# Patient Record
Sex: Female | Born: 1960 | Race: Black or African American | Hispanic: No | Marital: Married | Smoking: Never smoker
Health system: Southern US, Community
[De-identification: ages and names within clinical notes are randomized; demographics above are authoritative.]

## PROBLEM LIST (undated history)

## (undated) DIAGNOSIS — I10 Essential (primary) hypertension: Secondary | ICD-10-CM

## (undated) HISTORY — PX: ABDOMINAL HYSTERECTOMY: SHX81

---

## 1998-07-07 ENCOUNTER — Other Ambulatory Visit: Admission: RE | Admit: 1998-07-07 | Discharge: 1998-07-07 | Payer: Self-pay | Admitting: Obstetrics & Gynecology

## 2000-01-18 ENCOUNTER — Other Ambulatory Visit: Admission: RE | Admit: 2000-01-18 | Discharge: 2000-01-18 | Payer: Self-pay | Admitting: Obstetrics and Gynecology

## 2001-02-11 ENCOUNTER — Other Ambulatory Visit: Admission: RE | Admit: 2001-02-11 | Discharge: 2001-02-11 | Payer: Self-pay | Admitting: Obstetrics and Gynecology

## 2002-02-19 ENCOUNTER — Other Ambulatory Visit: Admission: RE | Admit: 2002-02-19 | Discharge: 2002-02-19 | Payer: Self-pay | Admitting: Obstetrics and Gynecology

## 2002-09-05 ENCOUNTER — Ambulatory Visit (HOSPITAL_COMMUNITY): Admission: RE | Admit: 2002-09-05 | Discharge: 2002-09-05 | Payer: Self-pay | Admitting: General Surgery

## 2002-09-05 ENCOUNTER — Encounter (INDEPENDENT_AMBULATORY_CARE_PROVIDER_SITE_OTHER): Payer: Self-pay | Admitting: Specialist

## 2008-11-26 ENCOUNTER — Ambulatory Visit: Payer: Self-pay | Admitting: Sports Medicine

## 2008-11-26 DIAGNOSIS — I10 Essential (primary) hypertension: Secondary | ICD-10-CM | POA: Insufficient documentation

## 2008-11-26 DIAGNOSIS — M25569 Pain in unspecified knee: Secondary | ICD-10-CM | POA: Insufficient documentation

## 2008-11-26 DIAGNOSIS — M2140 Flat foot [pes planus] (acquired), unspecified foot: Secondary | ICD-10-CM | POA: Insufficient documentation

## 2008-11-26 DIAGNOSIS — M25559 Pain in unspecified hip: Secondary | ICD-10-CM | POA: Insufficient documentation

## 2010-07-09 ENCOUNTER — Encounter: Payer: Self-pay | Admitting: *Deleted

## 2010-08-05 ENCOUNTER — Emergency Department (INDEPENDENT_AMBULATORY_CARE_PROVIDER_SITE_OTHER): Payer: Federal, State, Local not specified - PPO

## 2010-08-05 ENCOUNTER — Emergency Department (HOSPITAL_BASED_OUTPATIENT_CLINIC_OR_DEPARTMENT_OTHER)
Admission: EM | Admit: 2010-08-05 | Discharge: 2010-08-05 | Disposition: A | Discharge status: Home / Self Care | Payer: Federal, State, Local not specified - PPO | Attending: Emergency Medicine | Admitting: Emergency Medicine

## 2010-08-05 DIAGNOSIS — R059 Cough, unspecified: Secondary | ICD-10-CM

## 2010-08-05 DIAGNOSIS — R05 Cough: Secondary | ICD-10-CM

## 2010-08-05 DIAGNOSIS — J189 Pneumonia, unspecified organism: Secondary | ICD-10-CM | POA: Insufficient documentation

## 2010-08-05 DIAGNOSIS — R0989 Other specified symptoms and signs involving the circulatory and respiratory systems: Secondary | ICD-10-CM

## 2010-08-05 DIAGNOSIS — I10 Essential (primary) hypertension: Secondary | ICD-10-CM | POA: Insufficient documentation

## 2010-08-05 DIAGNOSIS — R509 Fever, unspecified: Secondary | ICD-10-CM

## 2020-12-15 ENCOUNTER — Encounter (HOSPITAL_BASED_OUTPATIENT_CLINIC_OR_DEPARTMENT_OTHER): Payer: Self-pay

## 2020-12-15 ENCOUNTER — Emergency Department (HOSPITAL_BASED_OUTPATIENT_CLINIC_OR_DEPARTMENT_OTHER): Payer: 59

## 2020-12-15 ENCOUNTER — Emergency Department (HOSPITAL_BASED_OUTPATIENT_CLINIC_OR_DEPARTMENT_OTHER)
Admission: EM | Admit: 2020-12-15 | Discharge: 2020-12-15 | Disposition: A | Discharge status: Home / Self Care | Payer: 59 | Attending: Emergency Medicine | Admitting: Emergency Medicine

## 2020-12-15 ENCOUNTER — Other Ambulatory Visit: Payer: Self-pay

## 2020-12-15 DIAGNOSIS — Z79899 Other long term (current) drug therapy: Secondary | ICD-10-CM | POA: Insufficient documentation

## 2020-12-15 DIAGNOSIS — R0602 Shortness of breath: Secondary | ICD-10-CM | POA: Diagnosis not present

## 2020-12-15 DIAGNOSIS — R079 Chest pain, unspecified: Secondary | ICD-10-CM

## 2020-12-15 DIAGNOSIS — J90 Pleural effusion, not elsewhere classified: Secondary | ICD-10-CM | POA: Insufficient documentation

## 2020-12-15 DIAGNOSIS — I10 Essential (primary) hypertension: Secondary | ICD-10-CM | POA: Diagnosis not present

## 2020-12-15 DIAGNOSIS — Z20822 Contact with and (suspected) exposure to covid-19: Secondary | ICD-10-CM | POA: Diagnosis not present

## 2020-12-15 DIAGNOSIS — R0789 Other chest pain: Secondary | ICD-10-CM | POA: Diagnosis present

## 2020-12-15 DIAGNOSIS — I313 Pericardial effusion (noninflammatory): Secondary | ICD-10-CM

## 2020-12-15 DIAGNOSIS — I3139 Other pericardial effusion (noninflammatory): Secondary | ICD-10-CM

## 2020-12-15 HISTORY — DX: Essential (primary) hypertension: I10

## 2020-12-15 LAB — TROPONIN I (HIGH SENSITIVITY)
Troponin I (High Sensitivity): 3 ng/L (ref <18)
Troponin I (High Sensitivity): 3 ng/L (ref <18)

## 2020-12-15 LAB — BASIC METABOLIC PANEL
Anion gap: 11 (ref 5–15)
BUN: 22 mg/dL — ABNORMAL HIGH (ref 6–20)
CO2: 24 mmol/L (ref 22–32)
Calcium: 9.2 mg/dL (ref 8.9–10.3)
Chloride: 102 mmol/L (ref 98–111)
Creatinine, Ser: 1.05 mg/dL — ABNORMAL HIGH (ref 0.44–1.00)
GFR, Estimated: >60 mL/min (ref >60)
Glucose, Bld: 120 mg/dL — ABNORMAL HIGH (ref 70–99)
Potassium: 3.4 mmol/L — ABNORMAL LOW (ref 3.5–5.1)
Sodium: 137 mmol/L (ref 135–145)

## 2020-12-15 LAB — CBC
HCT: 36 % (ref 36.0–46.0)
Hemoglobin: 11.3 g/dL — ABNORMAL LOW (ref 12.0–15.0)
MCH: 22.3 pg — ABNORMAL LOW (ref 26.0–34.0)
MCHC: 31.4 g/dL (ref 30.0–36.0)
MCV: 71 fL — ABNORMAL LOW (ref 80.0–100.0)
Platelets: 290 10*3/uL (ref 150–400)
RBC: 5.07 MIL/uL (ref 3.87–5.11)
RDW: 16.3 % — ABNORMAL HIGH (ref 11.5–15.5)
WBC: 10.1 10*3/uL (ref 4.0–10.5)
nRBC: 0 % (ref 0.0–0.2)

## 2020-12-15 LAB — RESP PANEL BY RT-PCR (FLU A&B, COVID) ARPGX2
Influenza A by PCR: NEGATIVE
Influenza B by PCR: NEGATIVE
SARS Coronavirus 2 by RT PCR: NEGATIVE

## 2020-12-15 LAB — BRAIN NATRIURETIC PEPTIDE: B Natriuretic Peptide: 15.1 pg/mL (ref 0.0–100.0)

## 2020-12-15 LAB — D-DIMER, QUANTITATIVE: D-Dimer, Quant: 1.09 ug/mL-FEU — ABNORMAL HIGH (ref 0.00–0.50)

## 2020-12-15 MED ORDER — FUROSEMIDE 10 MG/ML IJ SOLN
40.0000 mg | Freq: Once | INTRAMUSCULAR | Status: DC
  Filled 2020-12-15: qty 4

## 2020-12-15 MED ORDER — IOHEXOL 350 MG/ML SOLN
100.0000 mL | Freq: Once | INTRAVENOUS | Status: AC | PRN
  Administered 2020-12-15: 100 mL via INTRAVENOUS

## 2020-12-15 NOTE — ED Provider Notes (Signed)
[facility] EMERGENCY DEPARTMENT Provider Note   CSN: [id number] Arrival date & time: 12/15/20  1601     History Chief Complaint  Patient presents with   Chest Pain    Carrie Bright is a 60 y.o. female with Pmhx HTN who presents to the ED today with complaint of gradual onset, constant, substernal chest tightness that began 5-6 days ago. Pt also complains of dyspnea on exertion.  She reports issues intermittently for the past few months however has never been evaluated for same.  She states that it has been constant for the last couple of days however she noticed today while she was walking she could not finish her regular loop causing concern of prompting ED visit.  She does mention that 2 weeks ago she drove to and from [place] which is a 12-hour drive one way.  She also mentions that at 1 week ago she was having a pedicure and they were massaging her right calf and she noticed some pain and soreness to same.  She denies any history of DVT/PE.  She is not on exogenous hormones.  She denies any active malignancy.  No hemoptysis.  She denies any fevers or chills.  Denies any recent COVID infection.  She denies any orthopnea.   The history is provided by the patient and medical records.      Past Medical History:  Diagnosis Date   Hypertension     Patient Active Problem List   Diagnosis Date Noted   HYPERTENSION, BENIGN ESSENTIAL 11/26/2008   PAIN IN JOINT PELVIC REGION AND THIGH 11/26/2008   Pain in joint, lower leg 11/26/2008   PES PLANUS 11/26/2008    Past Surgical History:  Procedure Laterality Date   ABDOMINAL HYSTERECTOMY       OB History   No obstetric history on file.     No family history on file.  Social History   Tobacco Use   Smoking status: Never   Smokeless tobacco: Never  Vaping Use   Vaping Use: Never used  Substance Use Topics   Alcohol use: Never   Drug use: Never    Home Medications Prior to Admission medications    Medication Sig Start Date End Date Taking? Authorizing Provider  lisinopril (PRINIVIL,ZESTRIL) 10 MG tablet Take 10 mg by mouth daily.      [provider]    Allergies    Patient has no known allergies.  Review of Systems   Review of Systems  Constitutional:  Negative for chills, diaphoresis and fever.  Respiratory:  Positive for shortness of breath. Negative for cough.   Cardiovascular:  Positive for chest pain. Negative for palpitations and leg swelling.  Gastrointestinal:  Negative for nausea and vomiting.  Musculoskeletal:  Positive for arthralgias.  All other systems reviewed and are negative.  Physical Exam Updated Vital Signs BP (!) 136/93 (BP Location: Right Arm)   Pulse 76   Temp 99.1 F (37.3 C) (Oral)   Resp 18   Ht 5\' 4"  (1.626 m)   Wt 97.5 kg   SpO2 99%   BMI 36.90 kg/m   Physical Exam Vitals and nursing note reviewed.  Constitutional:      Appearance: She is not ill-appearing or diaphoretic.  HENT:     Head: Normocephalic and atraumatic.  Eyes:     Conjunctiva/sclera: Conjunctivae normal.  Cardiovascular:     Rate and Rhythm: Normal rate and regular rhythm.     Pulses:  Radial pulses are 2+ on the right side and 2+ on the left side.       Dorsalis pedis pulses are 2+ on the right side and 2+ on the left side.     Heart sounds: Normal heart sounds.  Pulmonary:     Effort: Pulmonary effort is normal.     Breath sounds: Normal breath sounds. No decreased breath sounds, wheezing, rhonchi or rales.  Abdominal:     Palpations: Abdomen is soft.     Tenderness: There is no abdominal tenderness.  Musculoskeletal:     Cervical back: Neck supple.     Right lower leg: No tenderness. No edema.     Left lower leg: No tenderness. No edema.  Skin:    General: Skin is warm and dry.  Neurological:     Mental Status: She is alert.    ED Results / Procedures / Treatments   Labs (all labs ordered are listed, but only abnormal results are  displayed) Labs Reviewed  BASIC METABOLIC PANEL - Abnormal; Notable for the following components:      Result Value   Potassium 3.4 (*)    Glucose, Bld 120 (*)    BUN 22 (*)    Creatinine, Ser 1.05 (*)    All other components within normal limits  CBC - Abnormal; Notable for the following components:   Hemoglobin 11.3 (*)    MCV 71.0 (*)    MCH 22.3 (*)    RDW 16.3 (*)    All other components within normal limits  D-DIMER, QUANTITATIVE - Abnormal; Notable for the following components:   D-Dimer, Quant 1.09 (*)    All other components within normal limits  BRAIN NATRIURETIC PEPTIDE  TROPONIN I (HIGH SENSITIVITY)  TROPONIN I (HIGH SENSITIVITY)    EKG EKG Interpretation  Date/Time:  Wednesday December 15 2020 16:09:33 EDT Ventricular Rate:  80 PR Interval:  152 QRS Duration: 78 QT Interval:  366 QTC Calculation: 422 R Axis:   56 Text Interpretation: Normal sinus rhythm Nonspecific T wave abnormality Abnormal ECG No prior ECG for comparison. No STEMI Confirmed by Theda Belfast ([id number]) on 12/15/2020 5:54:40 PM  Radiology DG Chest 2 View  Result Date: 12/15/2020 CLINICAL DATA:  Chest pain.  Shortness of breath. EXAM: CHEST - 2 VIEW COMPARISON:  Remote radiograph 08/05/2010 FINDINGS: Mild cardiomegaly with normal mediastinal contours. There is a small left pleural effusion. Suspected trace right pleural effusion. Minimal vascular congestion without pulmonary edema. No confluent airspace disease. No pneumothorax. No acute osseous abnormalities are seen. IMPRESSION: Mild cardiomegaly with vascular congestion. Small pleural effusions, left greater than right. Electronically Signed   By: Narda Rutherford M.D.   On: 12/15/2020 17:23   CT Angio Chest PE W/Cm &/Or Wo Cm  Result Date: 12/15/2020 CLINICAL DATA:  Positive D-dimer.  Concern for pulmonary embolism. EXAM: CT ANGIOGRAPHY CHEST WITH CONTRAST TECHNIQUE: Multidetector CT imaging of the chest was performed using the standard protocol  during bolus administration of intravenous contrast. Multiplanar CT image reconstructions and MIPs were obtained to evaluate the vascular anatomy. CONTRAST:  OMNIPAQUE IOHEXOL 350 MG/ML SOLN COMPARISON:  Chest radiograph dated 12/15/2020. FINDINGS: Cardiovascular: There is mild cardiomegaly. Small pericardial effusion measuring approximately 6 mm in thickness. Mild atherosclerotic calcification of the thoracic aorta. No aneurysmal dilatation or dissection. Evaluation of the pulmonary arteries is very limited due to respiratory motion artifact and suboptimal opacification and timing of the contrast. No large or central pulmonary artery embolus identified. V/Q scan may provide better  evaluation if there is high clinical concern for acute PE. Mediastinum/Nodes: No hilar or mediastinal adenopathy. The esophagus and the thyroid gland are grossly unremarkable. Lungs/Pleura: Small left and probable trace right pleural effusions. No consolidative changes or pneumothorax. The central airways are patent. Upper Abdomen: Subcentimeter left renal hypodense lesion, not characterized on this CT. Musculoskeletal: T7 hemangioma.  No acute osseous pathology. Review of the MIP images confirms the above findings. IMPRESSION: 1. No CT evidence of large or central pulmonary artery embolus. Evaluation of the pulmonary arteries is very limited due to respiratory motion artifact and suboptimal opacification and timing of the contrast. 2. Mild cardiomegaly with small pericardial effusion. 3. Small left and probable trace right pleural effusions. 4. Aortic Atherosclerosis (ICD10-I70.0). Electronically Signed   By: Elgie Collard M.D.   On: 12/15/2020 20:01   US Venous Img Lower Right (DVT Study)  Result Date: 12/15/2020 CLINICAL DATA:  Shortness of breath. EXAM: Right LOWER EXTREMITY VENOUS DOPPLER ULTRASOUND TECHNIQUE: Gray-scale sonography with compression, as well as color and duplex ultrasound, were performed to evaluate the  deep venous system(s) from the level of the common femoral vein through the popliteal and proximal calf veins. COMPARISON:  None. FINDINGS: VENOUS Normal compressibility of the common femoral, superficial femoral, and popliteal veins, as well as the visualized calf veins. Visualized portions of profunda femoral vein and great saphenous vein unremarkable. No filling defects to suggest DVT on grayscale or color Doppler imaging. Doppler waveforms show normal direction of venous flow, normal respiratory plasticity and response to augmentation. Limited views of the contralateral common femoral vein are unremarkable. OTHER None. Limitations: none IMPRESSION: Negative. Electronically Signed   By: Lupita Raider M.D.   On: 12/15/2020 18:23    Procedures Procedures   Medications Ordered in ED Medications  iohexol (OMNIPAQUE) 350 MG/ML injection 100 mL (100 mLs Intravenous Contrast Given 12/15/20 1923)    ED Course  I have reviewed the triage vital signs and the nursing notes.  Pertinent labs & imaging results that were available during my care of the patient were reviewed by me and considered in my medical decision making (see chart for details).    MDM Rules/Calculators/A&P                           60 year old female who presents to the ED today with complaint of constant substernal chest pain and shortness of breath, mostly on exertion for the past few days.  On arrival to the ED today patient's temperature is 99.1.  She is nontachycardic, nontachypneic, satting 99% on room air.  She appears well on exam.  She has equal pulses bilaterally.  She denies any risk factors for CAD.  She does mention a long car ride recently to [place] about 2 weeks ago as well as some calf tenderness palpation.  I am unable to [facility] her out secondary to age.  We will plan for D-dimer.  She did have troponin while in the waiting room which has returned normal at 3.  Her CBC does show a hemoglobin of 11.3, history of  anemia in the past.  She denies any bleeding.  BMP with creatinine slightly elevated at 1.05 and BUN 22.  X-ray pending at this time.  We will plan for repeat troponin, D-dimer, follow-up on chest x-ray.  We will also obtain ultrasound of right lower extremity as she mentions some pain in this area a week ago while getting a pedicure however no  tenderness palpation or swelling at this time.  X-ray with mild cardiomegaly and vascular congestion without history of same.  We will add on BNP at this time.  Question new onset CHF.   DVT study negative BNP 15.1 Troponin unchanged at 3 D dimer elevated at 1.09. Unable to age adjust. Will proceed with CTA.   CTA; IMPRESSION:  1. No CT evidence of large or central pulmonary artery embolus.  Evaluation of the pulmonary arteries is very limited due to  respiratory motion artifact and suboptimal opacification and timing  of the contrast.  2. Mild cardiomegaly with small pericardial effusion.  3. Small left and probable trace right pleural effusions.  4. Aortic Atherosclerosis (ICD10-I70.0).   Given worsening symptoms and unexplained pericardial effusion will plan to consult cardiology for further eval. At shift change case signed out to attending physician Dr. Rush Landmarkegeler who will assume patient care and dispo accordingly.   This note was prepared using Dragon voice recognition software and may include unintentional dictation errors due to the inherent limitations of voice recognition software.    Final Clinical Impression(s) / ED Diagnoses Final diagnoses:  Nonspecific chest pain  SOB (shortness of breath)  Pleural effusion    Rx / DC Orders ED Discharge Orders     None        Tanda RockersVenter, Abdiel Blackerby, PA-C 12/15/20 2024    Tegeler, Canary Brimhristopher J, MD 12/15/20 2059

## 2020-12-15 NOTE — ED Notes (Signed)
Blue top redrawn and walked to lab 

## 2020-12-15 NOTE — Discharge Instructions (Addendum)
Your history, exam, work-up today only revealed the small amount of fluid near your heart and your lungs as we discussed.  There was no evidence of blood clot.  We did send off a COVID and flu test which will return over the next few hours.  Please follow-up on MyChart for this result.  We discussed the case with cardiology who also feel you are safe for discharge home given your reassuring vital signs and overall well appearance.  They do feel it is reasonable to have you follow-up with them in clinic for likely outpatient ultrasound of your heart.  If any symptoms change or worsen acutely, please return immediately to the nearest emergency department.

## 2020-12-15 NOTE — ED Triage Notes (Signed)
Pt arrives with c/o off and on CP and SOB "for a while" reports it became more constant yesterday and today. Pt states yesterday she went up steps and because "more winded" than her normal with some tiredness and headaches.

## 2020-12-15 NOTE — ED Notes (Signed)
US at bedside

## 2020-12-15 NOTE — ED Notes (Signed)
Spoke to lab and they can add on the BNP to previously sent blood.

## 2020-12-22 DIAGNOSIS — R072 Precordial pain: Secondary | ICD-10-CM | POA: Insufficient documentation

## 2020-12-22 DIAGNOSIS — I700 Atherosclerosis of aorta: Secondary | ICD-10-CM | POA: Insufficient documentation

## 2020-12-22 DIAGNOSIS — I517 Cardiomegaly: Secondary | ICD-10-CM | POA: Insufficient documentation

## 2020-12-22 DIAGNOSIS — I3139 Other pericardial effusion (noninflammatory): Secondary | ICD-10-CM | POA: Insufficient documentation

## 2020-12-22 DIAGNOSIS — I313 Pericardial effusion (noninflammatory): Secondary | ICD-10-CM | POA: Insufficient documentation

## 2020-12-22 NOTE — Progress Notes (Signed)
Cardiology Office Note   Date:  12/26/2020   ID:  Carrie, Bright 30-Jan-1961, MRN [id number]  PCP:  Adolph Pollack, FNP  Cardiologist:   None Referring:  ED  Chief Complaint  Patient presents with   Chest Pain       History of Present Illness: DAIANNA Bright is a 60 y.o. female who presents for evaluation of chest pain. She was in the ED with chest pain last week.  I reviewed these records for this visit.   CT did not demonstrate a pulmonary embolism.   There was evidence of small pericardial effusion and pleural effusions on CXR.  There was mild cardiomegaly on echo.  .     She said that she has been getting the pain off and on for a year.  It is in her upper mid chest.  It goes through to her back.  She cannot associate it with anything in particular.  It does not come on necessarily with activity.  Its been at least moderate in intensity.  However, she went to the emergency room as it was lasting longer.  She had some 8 out of 10 discomfort.  It was mid chest.  It was dull.  She was short of breath.  This actually happened while she was out walking.  It subsequently went away on its own and she did not get any therapy in the emergency room.  She is able to do some activity such as running and weights without bringing this on routinely.  She has not been having any new shortness of breath, PND or orthopnea.  She does think her discomfort is more when she is lying flat that time she had to sit up.  She has not had any associated nausea or vomiting.  She has had no cough fevers or chills.   Past Medical History:  Diagnosis Date   Hypertension     Past Surgical History:  Procedure Laterality Date   ABDOMINAL HYSTERECTOMY       Current Outpatient Medications  Medication Sig Dispense Refill   amLODipine (NORVASC) 5 MG tablet Take 5 mg by mouth daily.     clobetasol ointment (TEMOVATE) 0.05 % clobetasol 0.05 % topical ointment  APPLY TO AFFECTED AREA ON THE SCALP  5 TIMES A WEEK. NEVER TO FACE, UNDERARMS, OR GENITAL REGION.     doxycycline (VIBRAMYCIN) 50 MG capsule Take 50 mg by mouth 2 (two) times daily.     hydrochlorothiazide (HYDRODIURIL) 25 MG tablet Take 25 mg by mouth daily.     meloxicam (MOBIC) 15 MG tablet Take 15 mg by mouth as needed.     telmisartan (MICARDIS) 40 MG tablet Take 40 mg by mouth daily.     No current facility-administered medications for this visit.    Allergies:   Patient has no known allergies.    Social History:  The patient  reports that she has never smoked. She has never used smokeless tobacco. She reports that she does not drink alcohol and does not use drugs.   Family History:  The patient's family history includes CAD in her father; Diabetes in her father; Hypertension in her mother.    ROS:  Please see the history of present illness.   Otherwise, review of systems are positive for none.   All other systems are reviewed and negative.    PHYSICAL EXAM: VS:  BP 130/80 (BP Location: Left Arm)   Pulse (!) 55   Ht 5'  4" (1.626 m)   Wt 216 lb 12.8 oz (98.3 kg)   SpO2 99%   BMI 37.21 kg/m  , BMI Body mass index is 37.21 kg/m. GENERAL:  Well appearing HEENT:  Pupils equal round and reactive, fundi not visualized, oral mucosa unremarkable NECK:  No jugular venous distention, waveform within normal limits, carotid upstroke brisk and symmetric, no bruits, no thyromegaly LYMPHATICS:  No cervical, inguinal adenopathy LUNGS:  Clear to auscultation bilaterally BACK:  No CVA tenderness CHEST:  Unremarkable HEART:  PMI not displaced or sustained,S1 and S2 within normal limits, no S3, no S4, no clicks, no rubs, no murmurs ABD:  Flat, positive bowel sounds normal in frequency in pitch, no bruits, no rebound, no guarding, no midline pulsatile mass, no hepatomegaly, no splenomegaly EXT:  2 plus pulses throughout, no edema, no cyanosis no clubbing SKIN:  No rashes no nodules NEURO:  Cranial nerves II through XII grossly  intact, motor grossly intact throughout PSYCH:  Cognitively intact, oriented to person place and time    EKG:  EKG is not ordered today. The ekg ordered today demonstrates sinus rhythm, rate 80, axis within normal limits, intervals within normal limits, nonspecific T wave flattening 12/15/2020   Recent Labs: 12/15/2020: B Natriuretic Peptide 15.1; BUN 22; Creatinine, Ser 1.05; Hemoglobin 11.3; Platelets 290; Potassium 3.4; Sodium 137    Lipid Panel No results found for: CHOL, TRIG, HDL, CHOLHDL, VLDL, LDLCALC, LDLDIRECT    Wt Readings from Last 3 Encounters:  12/23/20 216 lb 12.8 oz (98.3 kg)  12/15/20 215 lb (97.5 kg)      Other studies Reviewed: Additional studies/ records that were reviewed today include: ED records. Review of the above records demonstrates:  Please see elsewhere in the note.     ASSESSMENT AND PLAN:  CHEST PAIN: I think this has predominantly nonanginal qualities but there is aortic atherosclerosis and hypertension is a risk factor. I will bring the patient back for a POET (Plain Old Exercise Test). This will allow me to screen for obstructive coronary disease, risk stratify and very importantly provide a prescription for exercise.  PERICARDIAL EFFUSION: I will check an echocardiogram although I do not strongly suspect pericarditis.  CARDIOMEGALY: As above.  AORTIC ATHEROSCLEROSIS: I did review her lipids which were an LDL of 75 and an HDL of 55.  She her sugar followed by her primary provider.  No further work-up other than above.  Having her blood pressure managed.  She does not have other risk  Current medicines are reviewed at length with the patient today.  The patient does not have concerns regarding medicines.  The following changes have been made:  no change  Labs/ tests ordered today include:   Orders Placed This Encounter  Procedures   EXERCISE TOLERANCE TEST (ETT)   ECHOCARDIOGRAM COMPLETE      Disposition:   FU with me based on the  results of the above.      Signed, Rollene Rotunda, MD  12/26/2020 9:49 AM    [facility]

## 2020-12-23 ENCOUNTER — Other Ambulatory Visit: Payer: Self-pay

## 2020-12-23 ENCOUNTER — Ambulatory Visit (INDEPENDENT_AMBULATORY_CARE_PROVIDER_SITE_OTHER): Payer: 59 | Admitting: Cardiology

## 2020-12-23 ENCOUNTER — Encounter: Payer: Self-pay | Admitting: Cardiology

## 2020-12-23 VITALS — BP 130/80 | HR 55 | Ht 64.0 in | Wt 216.8 lb

## 2020-12-23 DIAGNOSIS — I700 Atherosclerosis of aorta: Secondary | ICD-10-CM | POA: Diagnosis not present

## 2020-12-23 DIAGNOSIS — I313 Pericardial effusion (noninflammatory): Secondary | ICD-10-CM

## 2020-12-23 DIAGNOSIS — R072 Precordial pain: Secondary | ICD-10-CM | POA: Diagnosis not present

## 2020-12-23 DIAGNOSIS — I3139 Other pericardial effusion (noninflammatory): Secondary | ICD-10-CM

## 2020-12-23 DIAGNOSIS — I517 Cardiomegaly: Secondary | ICD-10-CM

## 2020-12-23 NOTE — Patient Instructions (Signed)
Medication Instructions:  No Changes In Medications at this time.  *If you need a refill on your cardiac medications before your next appointment, please call your pharmacy*  Testing/Procedures: Your physician has requested that you have an exercise tolerance test. For further information please visit [email or web address] Please also follow instruction sheet, as given. This will take place at [address], [address]. Do not drink or eat foods with caffeine for 24 hours before the test. (Chocolate, coffee, tea, or energy drinks) If you use an inhaler, bring it with you to the test. Do not smoke for 4 hours before the test. Wear comfortable shoes and clothing.  Your physician has requested that you have an echocardiogram. Echocardiography is a painless test that uses sound waves to create images of your heart. It provides your doctor with information about the size and shape of your heart and how well your heart's chambers and valves are working. You may receive an ultrasound enhancing agent through an IV if needed to better visualize your heart during the echo.This procedure takes approximately one hour. There are no restrictions for this procedure. This will take place at the [address], [address].   Follow-Up: At [facility], you and your health needs are our priority.  As part of our continuing mission to provide you with exceptional heart care, we have created designated Provider Care Teams.  These Care Teams include your primary Cardiologist (physician) and Advanced Practice Providers (APPs -  Physician Assistants and Nurse Practitioners) who all work together to provide you with the care you need, when you need it.  Your next appointment:   AS NEEDED   The format for your next appointment:   In Person  Provider:   Rollene Rotunda, MD

## 2020-12-26 ENCOUNTER — Encounter: Payer: Self-pay | Admitting: Cardiology

## 2020-12-31 ENCOUNTER — Encounter (HOSPITAL_COMMUNITY): Payer: 59

## 2021-01-11 ENCOUNTER — Telehealth (HOSPITAL_COMMUNITY): Payer: Self-pay | Admitting: *Deleted

## 2021-01-11 ENCOUNTER — Other Ambulatory Visit: Payer: Self-pay | Admitting: *Deleted

## 2021-01-11 ENCOUNTER — Other Ambulatory Visit (HOSPITAL_BASED_OUTPATIENT_CLINIC_OR_DEPARTMENT_OTHER): Payer: Self-pay | Admitting: *Deleted

## 2021-01-11 DIAGNOSIS — R072 Precordial pain: Secondary | ICD-10-CM

## 2021-01-11 NOTE — Telephone Encounter (Signed)
Close encounter 

## 2021-01-12 ENCOUNTER — Ambulatory Visit (HOSPITAL_COMMUNITY)
Admission: RE | Admit: 2021-01-12 | Discharge: 2021-01-12 | Disposition: A | Discharge status: Home / Self Care | Payer: 59 | Source: Ambulatory Visit | Attending: Cardiovascular Disease | Admitting: Cardiovascular Disease

## 2021-01-12 ENCOUNTER — Other Ambulatory Visit: Payer: Self-pay

## 2021-01-12 DIAGNOSIS — I313 Pericardial effusion (noninflammatory): Secondary | ICD-10-CM | POA: Insufficient documentation

## 2021-01-12 DIAGNOSIS — I3139 Other pericardial effusion (noninflammatory): Secondary | ICD-10-CM

## 2021-01-12 DIAGNOSIS — R072 Precordial pain: Secondary | ICD-10-CM | POA: Diagnosis not present

## 2021-01-12 LAB — EXERCISE TOLERANCE TEST
Angina Index: 0
Duke Treadmill Score: 7
Estimated workload: 8.4
Exercise duration (min): 6 min
Exercise duration (sec): 58 s
MPHR: 162 {beats}/min
Peak HR: 148 {beats}/min
Percent HR: 91 %
Rest HR: 66 {beats}/min
ST Depression (mm): 0 mm

## 2021-01-14 ENCOUNTER — Ambulatory Visit (HOSPITAL_COMMUNITY): Payer: 59 | Attending: Cardiology

## 2021-01-14 ENCOUNTER — Other Ambulatory Visit: Payer: Self-pay

## 2021-01-14 DIAGNOSIS — I3139 Other pericardial effusion (noninflammatory): Secondary | ICD-10-CM

## 2021-01-14 DIAGNOSIS — R072 Precordial pain: Secondary | ICD-10-CM | POA: Insufficient documentation

## 2021-01-14 DIAGNOSIS — I313 Pericardial effusion (noninflammatory): Secondary | ICD-10-CM | POA: Diagnosis not present

## 2021-01-14 LAB — ECHOCARDIOGRAM COMPLETE
Area-P 1/2: 3.42 cm2
S' Lateral: 2.4 cm

## 2023-01-19 IMAGING — CT CT ANGIO CHEST
2 of 8 series · 19 of 36 positions shown · IV contrast (omnipaque)
Comparison: Chest radiograph dated 12/15/2020.

CLINICAL DATA: Positive D-dimer.  Concern for pulmonary embolism.

EXAM:
CT ANGIOGRAPHY CHEST WITH CONTRAST
TECHNIQUE: Multidetector CT imaging of the chest was performed using the
standard protocol during bolus administration of intravenous
contrast. Multiplanar CT image reconstructions and MIPs were
obtained to evaluate the vascular anatomy.
CONTRAST:  100mL OMNIPAQUE IOHEXOL 350 MG/ML SOLN

[Series 5: pe thins · axial · 0.68mm/px · z∈[-212,+64]mm · 18 of 310 slices shown]
[im 17/310  lung]
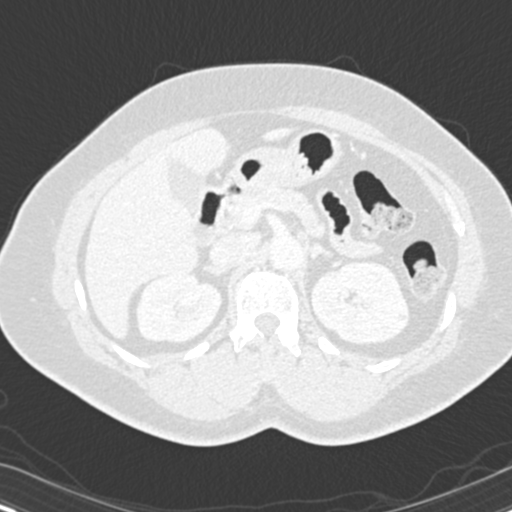
[im 33/310  mediastinal]
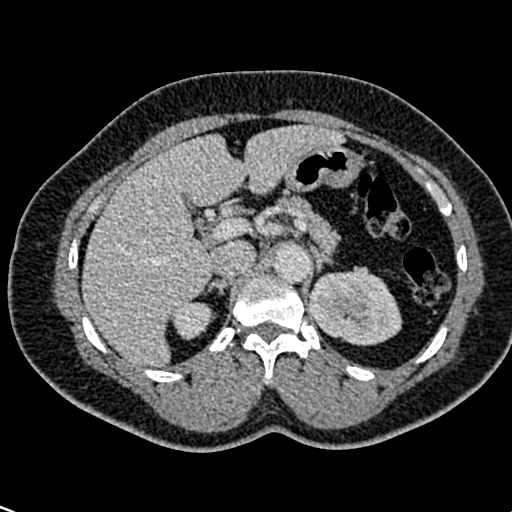
[im 49/310  lung]
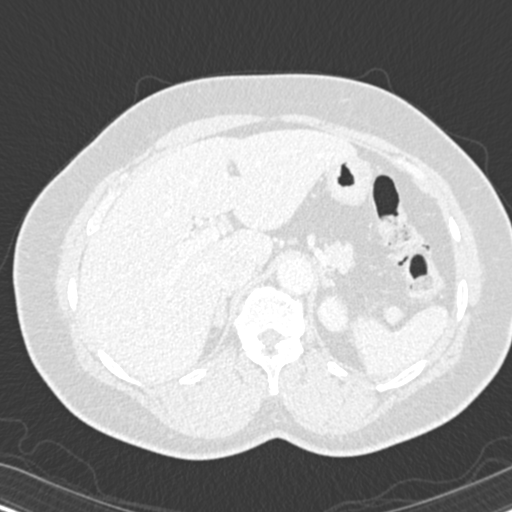
[im 66/310  mediastinal]
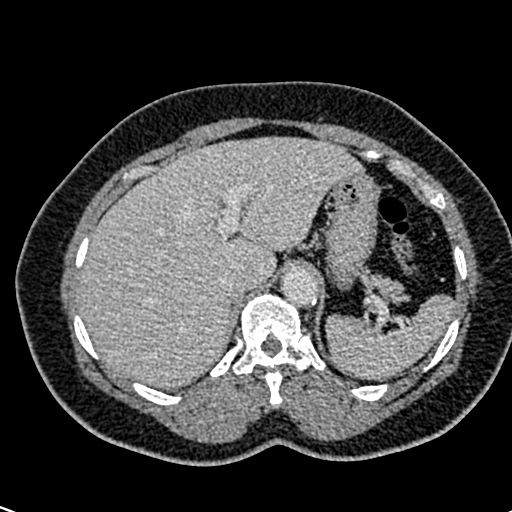
[im 82/310  lung]
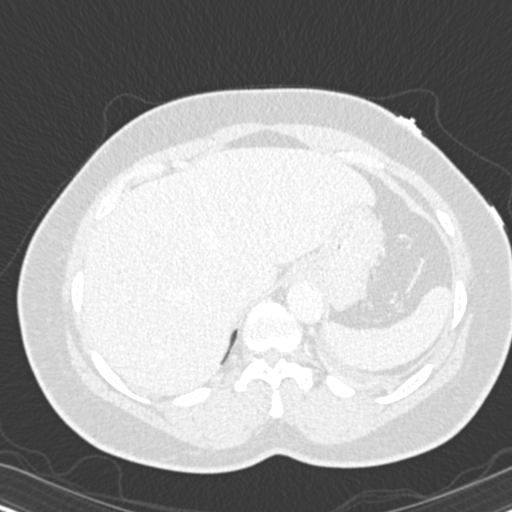
[im 98/310  mediastinal]
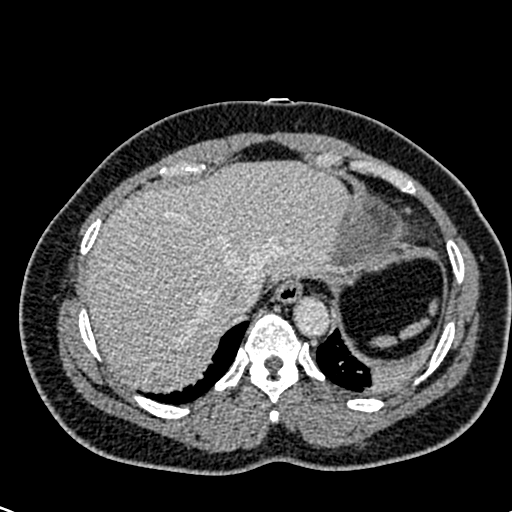
[im 114/310  lung]
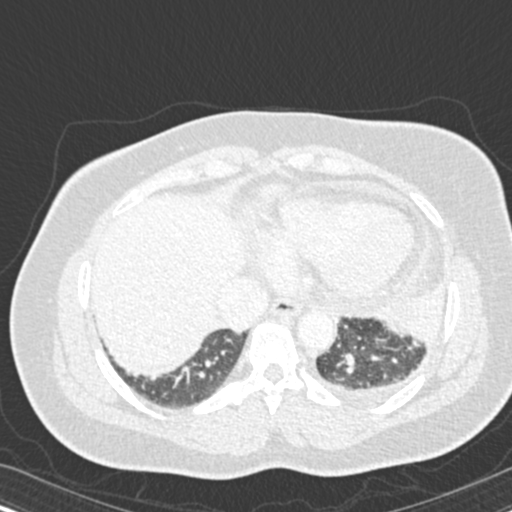
[im 131/310  mediastinal]
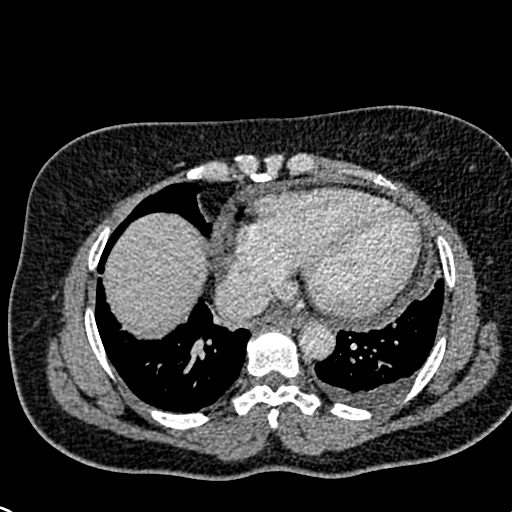
[im 147/310  lung]
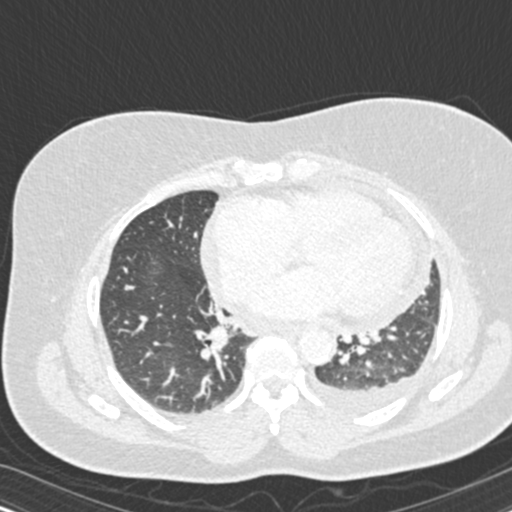
[im 163/310  mediastinal]
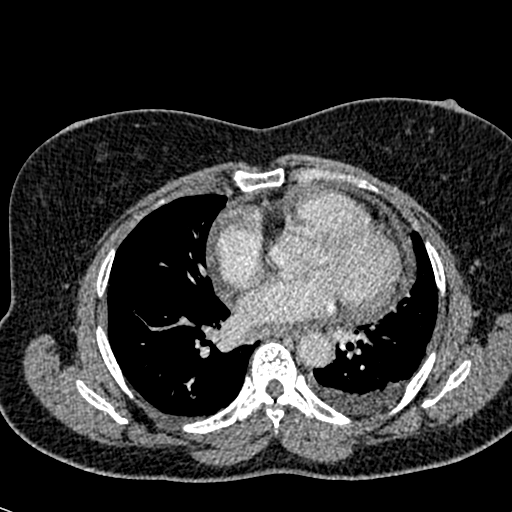
[im 179/310  lung]
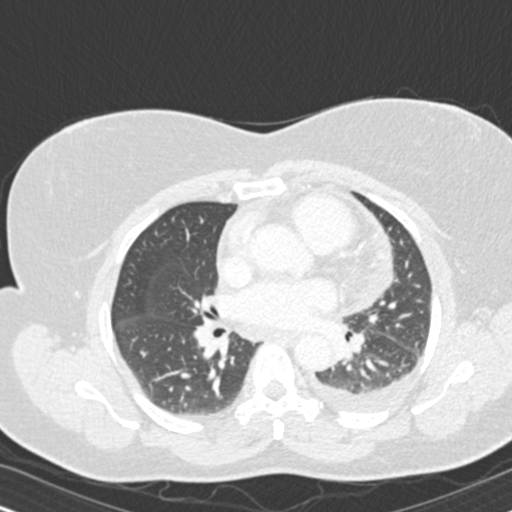
[im 196/310  mediastinal]
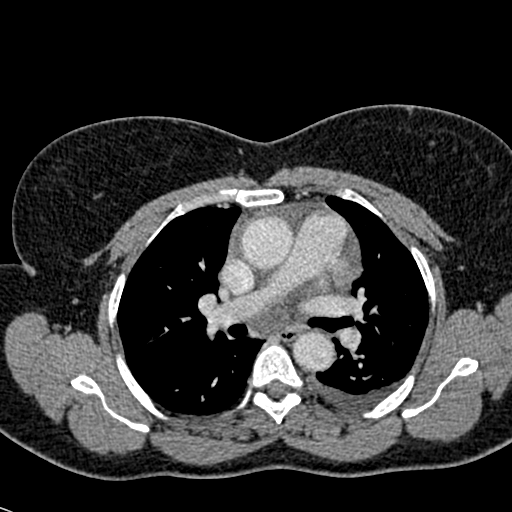
[im 212/310  lung]
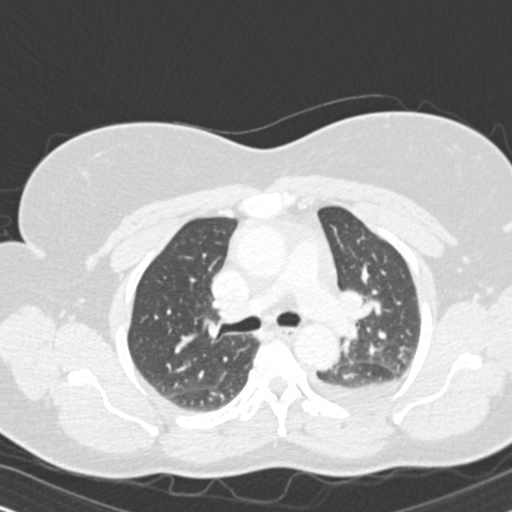
[im 228/310  mediastinal]
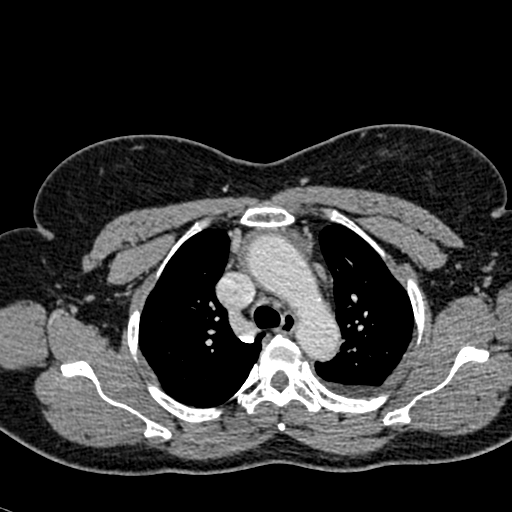
[im 244/310  lung]
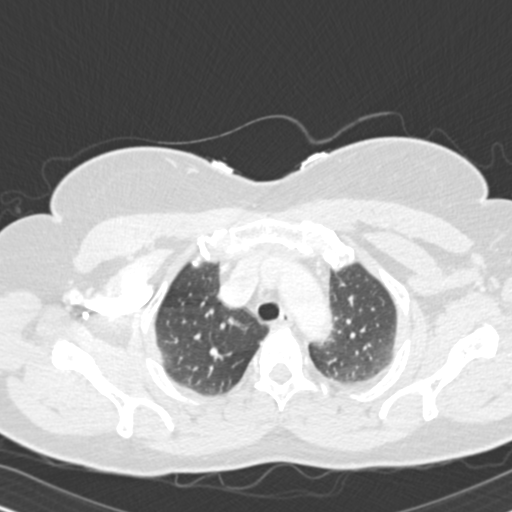
[im 261/310  mediastinal]
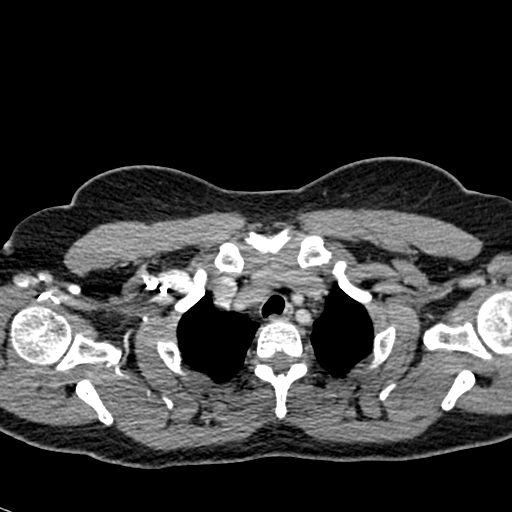
[im 277/310  lung]
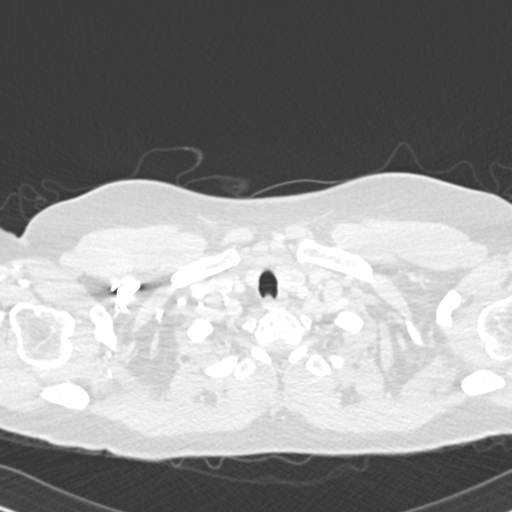
[im 293/310  mediastinal]
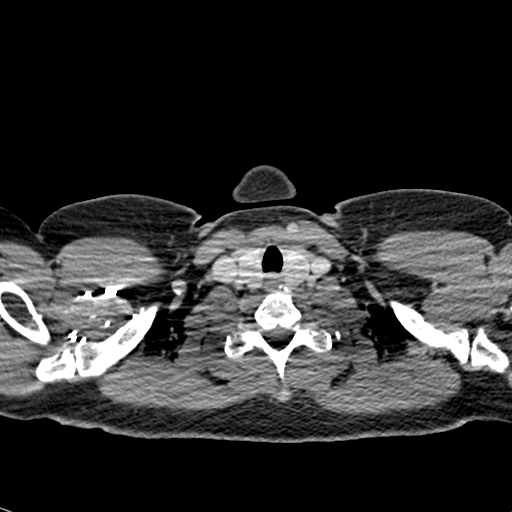

[Series 6: pe coronal mpr · coronal · 0.60mm/px · 1 of 151 slices shown]
[im 76/151  mediastinal]
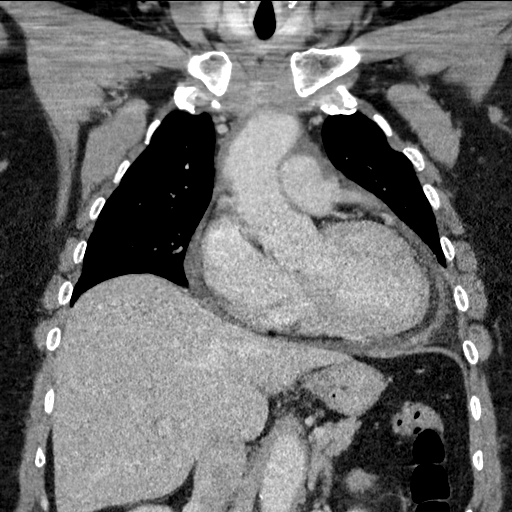

[19 of 36 positions shown; findings below may reference images not displayed]

FINDINGS: Cardiovascular: There is mild cardiomegaly. Small pericardial
effusion measuring approximately 6 mm in thickness. Mild
atherosclerotic calcification of the thoracic aorta. No aneurysmal
dilatation or dissection. Evaluation of the pulmonary arteries is
very limited due to respiratory motion artifact and suboptimal
opacification and timing of the contrast. No large or central
pulmonary artery embolus identified. V/Q scan may provide better
evaluation if there is high clinical concern for acute PE.

Mediastinum/Nodes: No hilar or mediastinal adenopathy. The esophagus
and the thyroid gland are grossly unremarkable.

Lungs/Pleura: Small left and probable trace right pleural effusions.
No consolidative changes or pneumothorax. The central airways are
patent.

Upper Abdomen: Subcentimeter left renal hypodense lesion, not
characterized on this CT.

Musculoskeletal: T7 hemangioma.  No acute osseous pathology.

Review of the MIP images confirms the above findings.
IMPRESSION: 1. No CT evidence of large or central pulmonary artery embolus.
Evaluation of the pulmonary arteries is very limited due to
respiratory motion artifact and suboptimal opacification and timing
of the contrast.
2. Mild cardiomegaly with small pericardial effusion.
3. Small left and probable trace right pleural effusions.
4. Aortic Atherosclerosis ([id number]).

## 2023-01-19 IMAGING — CR DG CHEST 2V
2 series · 2 of 2 positions shown · non-contrast
Comparison: Remote radiograph 08/05/2010

CLINICAL DATA: Chest pain.  Shortness of breath.

EXAM:
CHEST - 2 VIEW

[w chest pa]
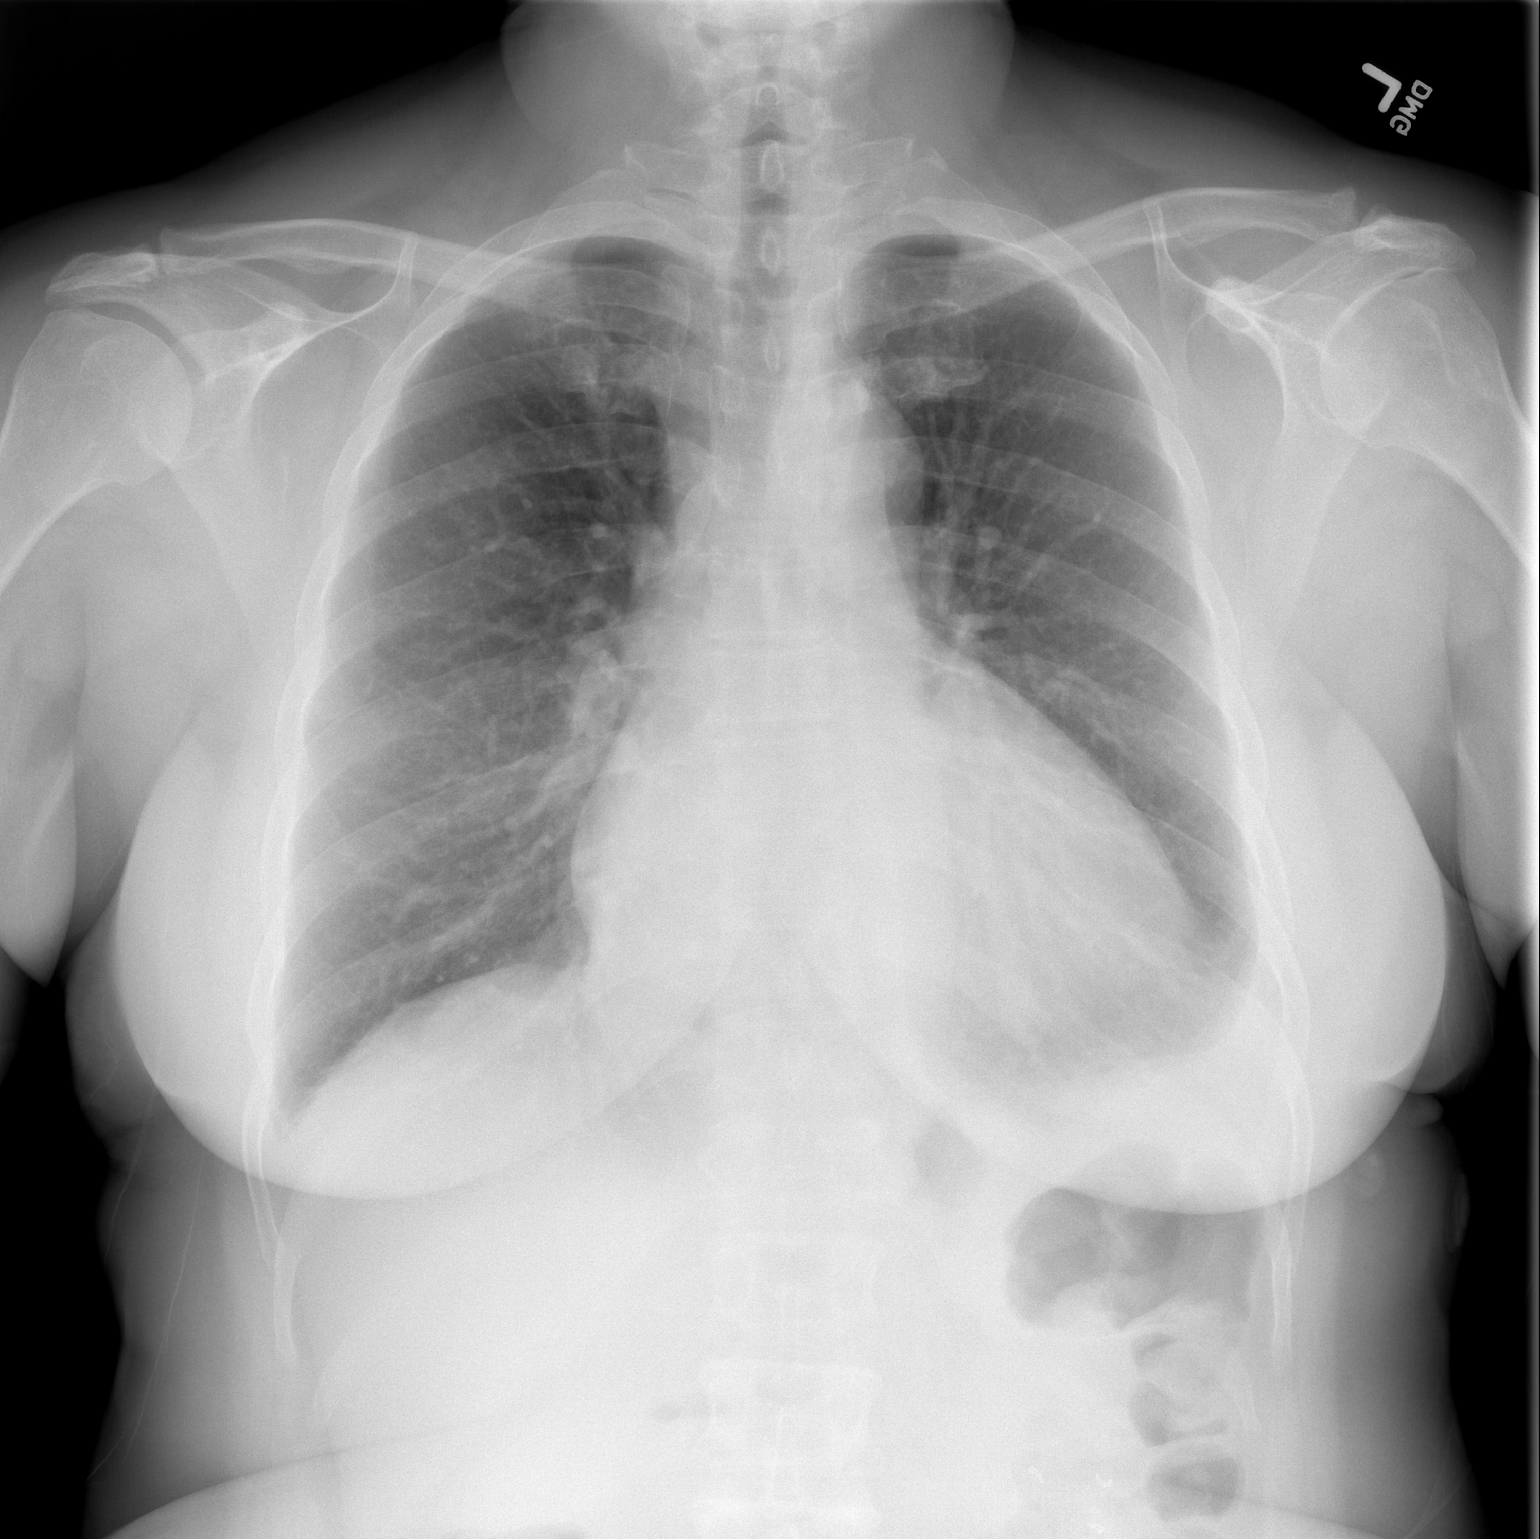

[w chest lat]
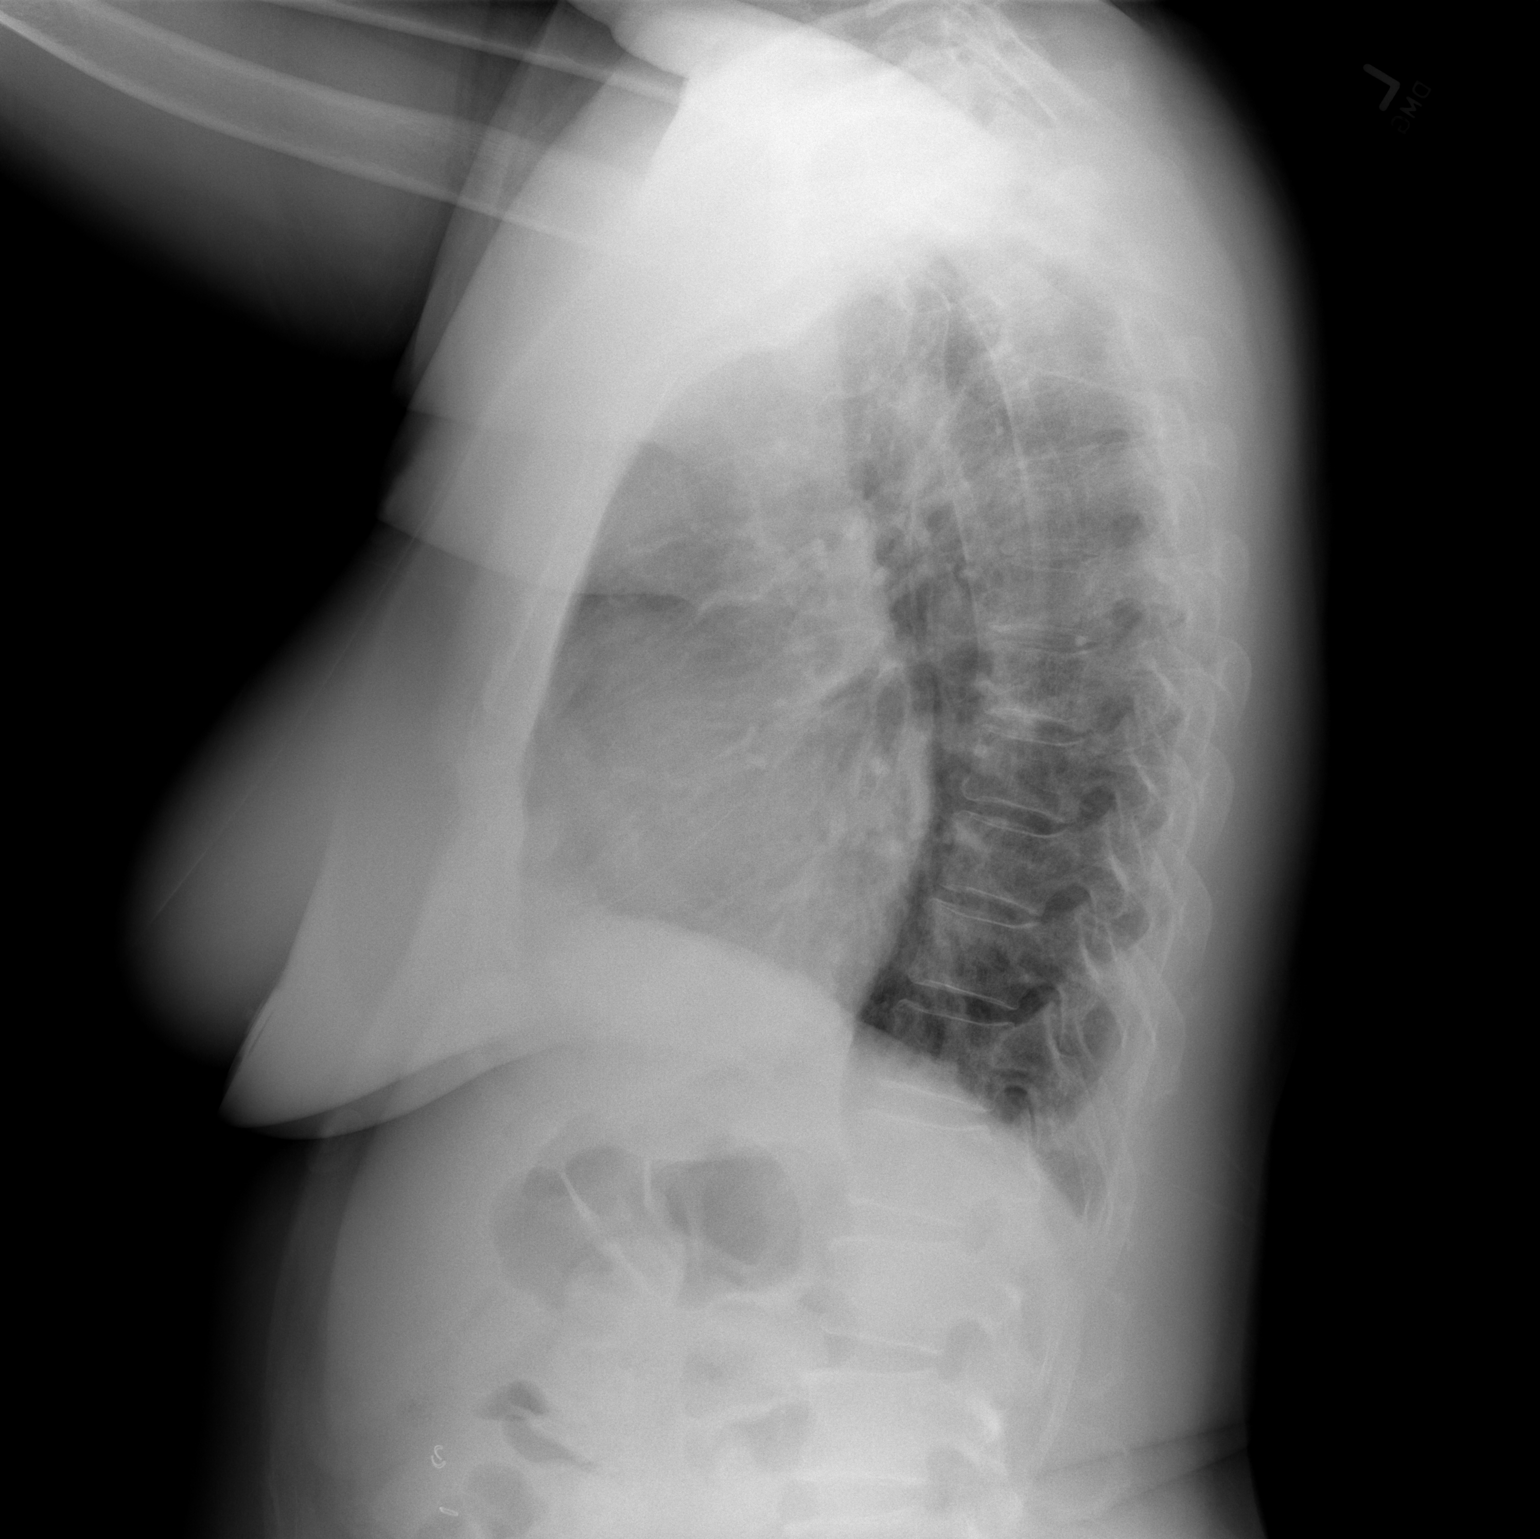

[2 of 2 positions shown; findings below may reference images not displayed]

FINDINGS: Mild cardiomegaly with normal mediastinal contours. There is a small
left pleural effusion. Suspected trace right pleural effusion.
Minimal vascular congestion without pulmonary edema. No confluent
airspace disease. No pneumothorax. No acute osseous abnormalities
are seen.
IMPRESSION: Mild cardiomegaly with vascular congestion. Small pleural effusions,
left greater than right.
